# Patient Record
Sex: Female | Born: 1995 | Race: White | Hispanic: No | Marital: Married | Smoking: Never smoker
Health system: Southern US, Community
[De-identification: ages and names within clinical notes are randomized; demographics above are authoritative.]

## PROBLEM LIST (undated history)

## (undated) DIAGNOSIS — J45909 Unspecified asthma, uncomplicated: Secondary | ICD-10-CM

## (undated) HISTORY — DX: Unspecified asthma, uncomplicated: J45.909

---
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
## 2016-01-02 DIAGNOSIS — H5210 Myopia, unspecified eye: Secondary | ICD-10-CM | POA: Diagnosis not present

## 2016-02-21 DIAGNOSIS — K080 Exfoliation of teeth due to systemic causes: Secondary | ICD-10-CM | POA: Diagnosis not present

## 2016-05-06 DIAGNOSIS — J029 Acute pharyngitis, unspecified: Secondary | ICD-10-CM | POA: Diagnosis not present

## 2016-05-06 DIAGNOSIS — E559 Vitamin D deficiency, unspecified: Secondary | ICD-10-CM | POA: Diagnosis not present

## 2016-05-06 DIAGNOSIS — Z0000 Encounter for general adult medical examination without abnormal findings: Secondary | ICD-10-CM | POA: Diagnosis not present

## 2016-05-07 DIAGNOSIS — Z13220 Encounter for screening for lipoid disorders: Secondary | ICD-10-CM | POA: Diagnosis not present

## 2016-05-07 DIAGNOSIS — N390 Urinary tract infection, site not specified: Secondary | ICD-10-CM | POA: Diagnosis not present

## 2016-05-07 DIAGNOSIS — J029 Acute pharyngitis, unspecified: Secondary | ICD-10-CM | POA: Diagnosis not present

## 2016-05-07 DIAGNOSIS — Z0000 Encounter for general adult medical examination without abnormal findings: Secondary | ICD-10-CM | POA: Diagnosis not present

## 2016-08-19 ENCOUNTER — Ambulatory Visit (INDEPENDENT_AMBULATORY_CARE_PROVIDER_SITE_OTHER): Payer: Self-pay | Admitting: Orthopedic Surgery

## 2016-08-21 ENCOUNTER — Ambulatory Visit (INDEPENDENT_AMBULATORY_CARE_PROVIDER_SITE_OTHER): Payer: 59 | Admitting: Orthopedic Surgery

## 2016-08-22 ENCOUNTER — Other Ambulatory Visit (INDEPENDENT_AMBULATORY_CARE_PROVIDER_SITE_OTHER): Payer: Self-pay | Admitting: *Deleted

## 2016-08-22 DIAGNOSIS — M25512 Pain in left shoulder: Secondary | ICD-10-CM

## 2016-09-13 ENCOUNTER — Encounter: Payer: Self-pay | Admitting: Radiology

## 2016-09-13 ENCOUNTER — Ambulatory Visit
Admission: RE | Admit: 2016-09-13 | Discharge: 2016-09-13 | Disposition: A | Discharge status: Home / Self Care | Payer: BLUE CROSS/BLUE SHIELD | Source: Ambulatory Visit | Attending: Orthopedic Surgery | Admitting: Orthopedic Surgery

## 2016-09-13 DIAGNOSIS — M25512 Pain in left shoulder: Secondary | ICD-10-CM

## 2016-09-13 MED ORDER — IOPAMIDOL (ISOVUE-M 200) INJECTION 41%
15.0000 mL | Freq: Once | INTRAMUSCULAR | Status: AC
  Administered 2016-09-13: 15 mL via INTRA_ARTICULAR

## 2016-10-22 DIAGNOSIS — K080 Exfoliation of teeth due to systemic causes: Secondary | ICD-10-CM | POA: Diagnosis not present

## 2016-12-16 DIAGNOSIS — M7542 Impingement syndrome of left shoulder: Secondary | ICD-10-CM | POA: Diagnosis not present

## 2016-12-16 DIAGNOSIS — M9907 Segmental and somatic dysfunction of upper extremity: Secondary | ICD-10-CM | POA: Diagnosis not present

## 2016-12-16 DIAGNOSIS — M9902 Segmental and somatic dysfunction of thoracic region: Secondary | ICD-10-CM | POA: Diagnosis not present

## 2016-12-16 DIAGNOSIS — M9901 Segmental and somatic dysfunction of cervical region: Secondary | ICD-10-CM | POA: Diagnosis not present

## 2016-12-19 DIAGNOSIS — H5213 Myopia, bilateral: Secondary | ICD-10-CM | POA: Diagnosis not present

## 2016-12-19 DIAGNOSIS — H52223 Regular astigmatism, bilateral: Secondary | ICD-10-CM | POA: Diagnosis not present

## 2016-12-20 DIAGNOSIS — M9907 Segmental and somatic dysfunction of upper extremity: Secondary | ICD-10-CM | POA: Diagnosis not present

## 2016-12-20 DIAGNOSIS — M9902 Segmental and somatic dysfunction of thoracic region: Secondary | ICD-10-CM | POA: Diagnosis not present

## 2016-12-20 DIAGNOSIS — M7542 Impingement syndrome of left shoulder: Secondary | ICD-10-CM | POA: Diagnosis not present

## 2016-12-20 DIAGNOSIS — M9901 Segmental and somatic dysfunction of cervical region: Secondary | ICD-10-CM | POA: Diagnosis not present

## 2016-12-23 DIAGNOSIS — H5210 Myopia, unspecified eye: Secondary | ICD-10-CM | POA: Diagnosis not present

## 2016-12-24 DIAGNOSIS — M9902 Segmental and somatic dysfunction of thoracic region: Secondary | ICD-10-CM | POA: Diagnosis not present

## 2016-12-24 DIAGNOSIS — M9907 Segmental and somatic dysfunction of upper extremity: Secondary | ICD-10-CM | POA: Diagnosis not present

## 2016-12-24 DIAGNOSIS — M9901 Segmental and somatic dysfunction of cervical region: Secondary | ICD-10-CM | POA: Diagnosis not present

## 2016-12-24 DIAGNOSIS — M7542 Impingement syndrome of left shoulder: Secondary | ICD-10-CM | POA: Diagnosis not present

## 2016-12-30 DIAGNOSIS — M7542 Impingement syndrome of left shoulder: Secondary | ICD-10-CM | POA: Diagnosis not present

## 2016-12-30 DIAGNOSIS — M9901 Segmental and somatic dysfunction of cervical region: Secondary | ICD-10-CM | POA: Diagnosis not present

## 2016-12-30 DIAGNOSIS — M9907 Segmental and somatic dysfunction of upper extremity: Secondary | ICD-10-CM | POA: Diagnosis not present

## 2016-12-30 DIAGNOSIS — M9902 Segmental and somatic dysfunction of thoracic region: Secondary | ICD-10-CM | POA: Diagnosis not present

## 2017-01-27 DIAGNOSIS — D2239 Melanocytic nevi of other parts of face: Secondary | ICD-10-CM | POA: Diagnosis not present

## 2017-01-27 DIAGNOSIS — L918 Other hypertrophic disorders of the skin: Secondary | ICD-10-CM | POA: Diagnosis not present

## 2017-01-27 DIAGNOSIS — L905 Scar conditions and fibrosis of skin: Secondary | ICD-10-CM | POA: Diagnosis not present

## 2017-01-27 DIAGNOSIS — D2262 Melanocytic nevi of left upper limb, including shoulder: Secondary | ICD-10-CM | POA: Diagnosis not present

## 2017-04-25 DIAGNOSIS — N390 Urinary tract infection, site not specified: Secondary | ICD-10-CM | POA: Diagnosis not present

## 2017-04-30 DIAGNOSIS — K080 Exfoliation of teeth due to systemic causes: Secondary | ICD-10-CM | POA: Diagnosis not present

## 2017-05-06 DIAGNOSIS — E559 Vitamin D deficiency, unspecified: Secondary | ICD-10-CM | POA: Diagnosis not present

## 2017-05-06 DIAGNOSIS — Z0000 Encounter for general adult medical examination without abnormal findings: Secondary | ICD-10-CM | POA: Diagnosis not present

## 2017-06-03 DIAGNOSIS — M7542 Impingement syndrome of left shoulder: Secondary | ICD-10-CM | POA: Diagnosis not present

## 2017-06-03 DIAGNOSIS — M9902 Segmental and somatic dysfunction of thoracic region: Secondary | ICD-10-CM | POA: Diagnosis not present

## 2017-06-03 DIAGNOSIS — M9901 Segmental and somatic dysfunction of cervical region: Secondary | ICD-10-CM | POA: Diagnosis not present

## 2017-06-03 DIAGNOSIS — M9907 Segmental and somatic dysfunction of upper extremity: Secondary | ICD-10-CM | POA: Diagnosis not present

## 2017-06-13 DIAGNOSIS — Z6820 Body mass index (BMI) 20.0-20.9, adult: Secondary | ICD-10-CM | POA: Diagnosis not present

## 2017-06-13 DIAGNOSIS — Z113 Encounter for screening for infections with a predominantly sexual mode of transmission: Secondary | ICD-10-CM | POA: Diagnosis not present

## 2017-06-13 DIAGNOSIS — Z01419 Encounter for gynecological examination (general) (routine) without abnormal findings: Secondary | ICD-10-CM | POA: Diagnosis not present

## 2017-09-09 DIAGNOSIS — Z30430 Encounter for insertion of intrauterine contraceptive device: Secondary | ICD-10-CM | POA: Diagnosis not present

## 2017-10-07 DIAGNOSIS — L659 Nonscarring hair loss, unspecified: Secondary | ICD-10-CM | POA: Diagnosis not present

## 2017-10-07 DIAGNOSIS — Z8349 Family history of other endocrine, nutritional and metabolic diseases: Secondary | ICD-10-CM | POA: Diagnosis not present

## 2017-10-07 DIAGNOSIS — R779 Abnormality of plasma protein, unspecified: Secondary | ICD-10-CM | POA: Diagnosis not present

## 2017-10-15 DIAGNOSIS — R779 Abnormality of plasma protein, unspecified: Secondary | ICD-10-CM | POA: Diagnosis not present

## 2017-10-15 DIAGNOSIS — L659 Nonscarring hair loss, unspecified: Secondary | ICD-10-CM | POA: Diagnosis not present

## 2017-10-15 DIAGNOSIS — Z8349 Family history of other endocrine, nutritional and metabolic diseases: Secondary | ICD-10-CM | POA: Diagnosis not present

## 2017-10-22 DIAGNOSIS — Z30431 Encounter for routine checking of intrauterine contraceptive device: Secondary | ICD-10-CM | POA: Diagnosis not present

## 2018-01-11 IMAGING — XA DG FLUORO GUIDE NDL PLC/BX
2 series · 2 of 2 positions shown · non-contrast
Comparison: none

CLINICAL DATA: Collegiate level golfer, with LEFT shoulder
subluxation/dislocation. Pain. Evaluate for labral pathology.

[Series 1: ortho standard · 1 of 1 slices shown (1 of 2)]
[im 1/1]
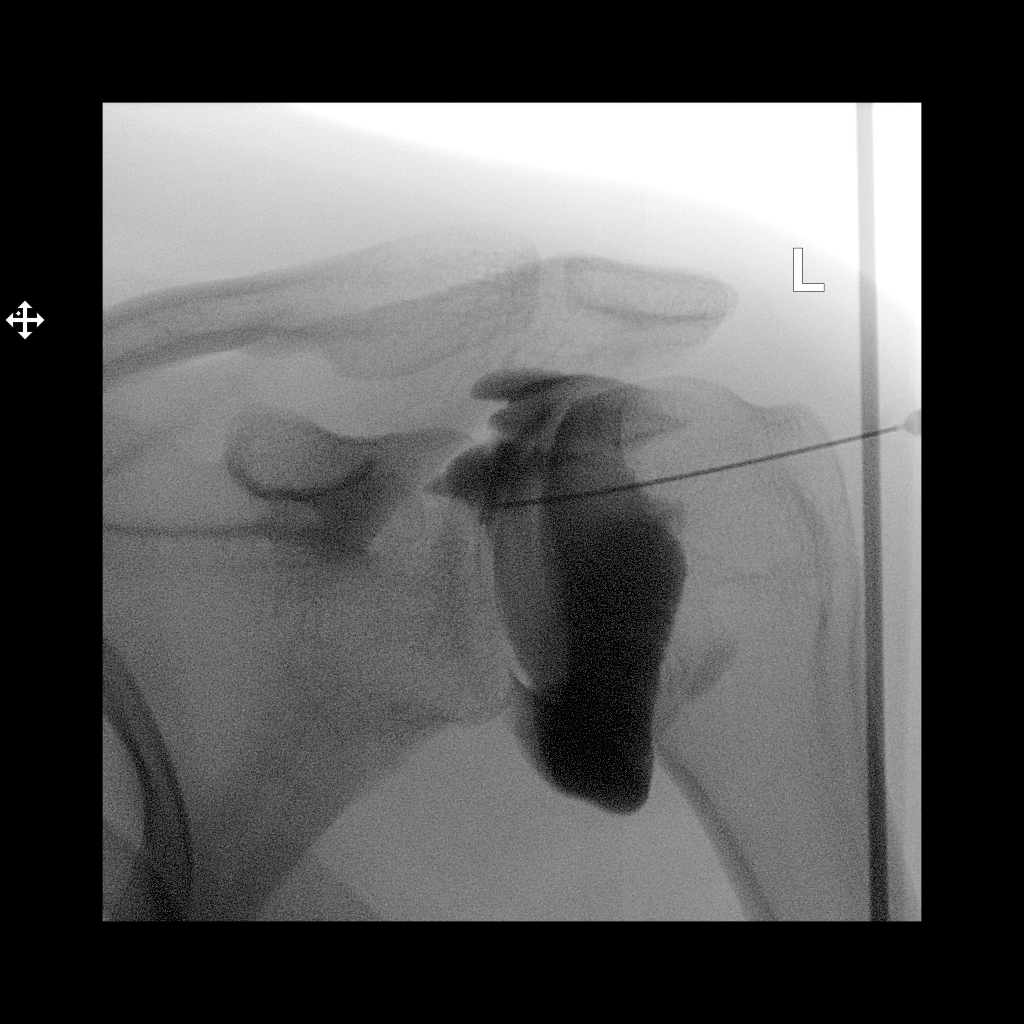

[Series 2: ortho standard · 1 of 1 slices shown (2 of 2)]
[im 1/1]
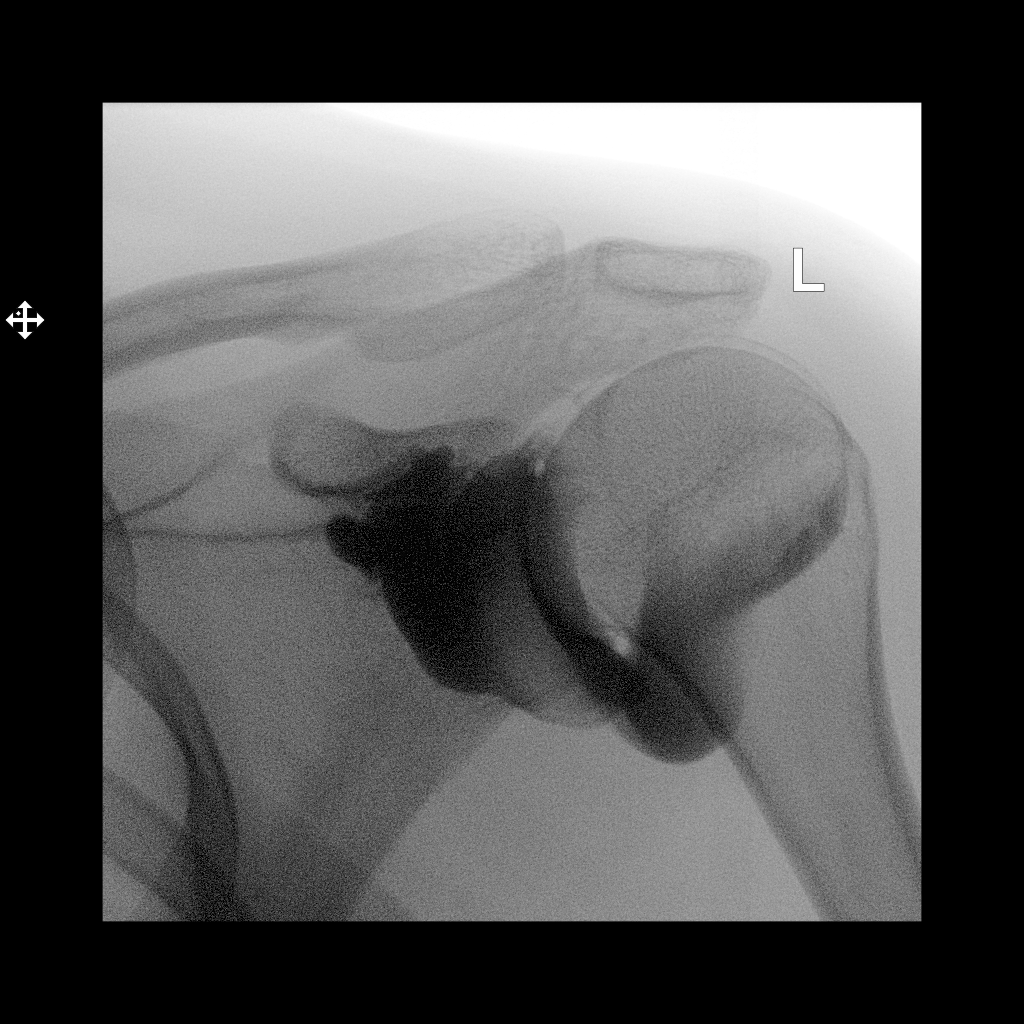

[2 of 2 positions shown; findings below may reference images not displayed]

FLUOROSCOPY TIME:  21 seconds corresponding to a Dose Area Product
of 6.23 ?Gy*m2

PROCEDURE:
LEFT SHOULDER INJECTION UNDER FLUOROSCOPY

Informed written consent was obtained.  Time-out was performed.

An appropriate skin entrance site was determined. The site was
marked, prepped with Betadine, draped in the usual sterile fashion,
and infiltrated locally with 1% Lidocaine. 22 gauge spinal needle
was advanced to the superomedial margin of the humeral head under
intermittent fluoroscopy. 1 ml of Lidocaine injected easily. A
mixture of 0.1 ml Multihance and 20 ml of dilute Omnipaque 180 was
then used to opacify the LEFT shoulder capsule. No immediate
complication.
IMPRESSION: Technically successful LEFT shoulder injection for MRI.

## 2018-01-29 DIAGNOSIS — N920 Excessive and frequent menstruation with regular cycle: Secondary | ICD-10-CM | POA: Diagnosis not present

## 2018-01-29 DIAGNOSIS — R102 Pelvic and perineal pain: Secondary | ICD-10-CM | POA: Diagnosis not present

## 2018-01-29 DIAGNOSIS — Z30432 Encounter for removal of intrauterine contraceptive device: Secondary | ICD-10-CM | POA: Diagnosis not present

## 2018-03-13 DIAGNOSIS — Z111 Encounter for screening for respiratory tuberculosis: Secondary | ICD-10-CM | POA: Diagnosis not present

## 2018-05-28 DIAGNOSIS — M9907 Segmental and somatic dysfunction of upper extremity: Secondary | ICD-10-CM | POA: Diagnosis not present

## 2018-05-28 DIAGNOSIS — M9901 Segmental and somatic dysfunction of cervical region: Secondary | ICD-10-CM | POA: Diagnosis not present

## 2018-05-28 DIAGNOSIS — M7542 Impingement syndrome of left shoulder: Secondary | ICD-10-CM | POA: Diagnosis not present

## 2018-05-28 DIAGNOSIS — M9902 Segmental and somatic dysfunction of thoracic region: Secondary | ICD-10-CM | POA: Diagnosis not present

## 2018-08-03 DIAGNOSIS — E559 Vitamin D deficiency, unspecified: Secondary | ICD-10-CM | POA: Diagnosis not present

## 2018-08-03 DIAGNOSIS — J45909 Unspecified asthma, uncomplicated: Secondary | ICD-10-CM | POA: Diagnosis not present

## 2018-08-03 DIAGNOSIS — J309 Allergic rhinitis, unspecified: Secondary | ICD-10-CM | POA: Diagnosis not present

## 2018-08-03 DIAGNOSIS — Z8639 Personal history of other endocrine, nutritional and metabolic disease: Secondary | ICD-10-CM | POA: Diagnosis not present

## 2020-01-31 DIAGNOSIS — Z01419 Encounter for gynecological examination (general) (routine) without abnormal findings: Secondary | ICD-10-CM | POA: Diagnosis not present

## 2020-01-31 DIAGNOSIS — Z6821 Body mass index (BMI) 21.0-21.9, adult: Secondary | ICD-10-CM | POA: Diagnosis not present

## 2021-01-21 DIAGNOSIS — R059 Cough, unspecified: Secondary | ICD-10-CM | POA: Diagnosis not present

## 2021-01-21 DIAGNOSIS — J069 Acute upper respiratory infection, unspecified: Secondary | ICD-10-CM | POA: Diagnosis not present

## 2021-01-21 DIAGNOSIS — Z03818 Encounter for observation for suspected exposure to other biological agents ruled out: Secondary | ICD-10-CM | POA: Diagnosis not present

## 2021-01-21 DIAGNOSIS — J029 Acute pharyngitis, unspecified: Secondary | ICD-10-CM | POA: Diagnosis not present

## 2021-01-21 DIAGNOSIS — R519 Headache, unspecified: Secondary | ICD-10-CM | POA: Diagnosis not present

## 2021-01-23 DIAGNOSIS — R0989 Other specified symptoms and signs involving the circulatory and respiratory systems: Secondary | ICD-10-CM | POA: Diagnosis not present

## 2021-01-23 DIAGNOSIS — R051 Acute cough: Secondary | ICD-10-CM | POA: Diagnosis not present

## 2021-03-15 DIAGNOSIS — Z30431 Encounter for routine checking of intrauterine contraceptive device: Secondary | ICD-10-CM | POA: Diagnosis not present

## 2021-03-15 DIAGNOSIS — Z30432 Encounter for removal of intrauterine contraceptive device: Secondary | ICD-10-CM | POA: Diagnosis not present

## 2021-04-10 DIAGNOSIS — J45909 Unspecified asthma, uncomplicated: Secondary | ICD-10-CM | POA: Diagnosis not present

## 2021-04-10 DIAGNOSIS — E559 Vitamin D deficiency, unspecified: Secondary | ICD-10-CM | POA: Diagnosis not present

## 2021-04-10 DIAGNOSIS — Z8639 Personal history of other endocrine, nutritional and metabolic disease: Secondary | ICD-10-CM | POA: Diagnosis not present

## 2021-04-10 DIAGNOSIS — R1013 Epigastric pain: Secondary | ICD-10-CM | POA: Diagnosis not present

## 2021-05-08 DIAGNOSIS — Z8639 Personal history of other endocrine, nutritional and metabolic disease: Secondary | ICD-10-CM | POA: Diagnosis not present

## 2021-05-08 DIAGNOSIS — J45909 Unspecified asthma, uncomplicated: Secondary | ICD-10-CM | POA: Diagnosis not present

## 2021-05-08 DIAGNOSIS — E559 Vitamin D deficiency, unspecified: Secondary | ICD-10-CM | POA: Diagnosis not present

## 2021-05-08 DIAGNOSIS — R1013 Epigastric pain: Secondary | ICD-10-CM | POA: Diagnosis not present

## 2021-05-08 DIAGNOSIS — Z13220 Encounter for screening for lipoid disorders: Secondary | ICD-10-CM | POA: Diagnosis not present

## 2021-05-15 DIAGNOSIS — Z8639 Personal history of other endocrine, nutritional and metabolic disease: Secondary | ICD-10-CM | POA: Diagnosis not present

## 2021-05-15 DIAGNOSIS — H6123 Impacted cerumen, bilateral: Secondary | ICD-10-CM | POA: Diagnosis not present

## 2021-05-15 DIAGNOSIS — J45909 Unspecified asthma, uncomplicated: Secondary | ICD-10-CM | POA: Diagnosis not present

## 2021-05-15 DIAGNOSIS — Z0000 Encounter for general adult medical examination without abnormal findings: Secondary | ICD-10-CM | POA: Diagnosis not present

## 2021-05-15 DIAGNOSIS — J309 Allergic rhinitis, unspecified: Secondary | ICD-10-CM | POA: Diagnosis not present

## 2023-08-29 ENCOUNTER — Other Ambulatory Visit: Payer: Self-pay | Admitting: Obstetrics and Gynecology

## 2023-08-29 ENCOUNTER — Ambulatory Visit

## 2023-08-29 DIAGNOSIS — O283 Abnormal ultrasonic finding on antenatal screening of mother: Secondary | ICD-10-CM

## 2023-09-01 ENCOUNTER — Other Ambulatory Visit: Payer: Self-pay

## 2023-09-01 DIAGNOSIS — O283 Abnormal ultrasonic finding on antenatal screening of mother: Secondary | ICD-10-CM | POA: Insufficient documentation

## 2023-09-02 ENCOUNTER — Ambulatory Visit: Attending: Obstetrics and Gynecology

## 2023-09-02 ENCOUNTER — Other Ambulatory Visit: Payer: Self-pay | Admitting: *Deleted

## 2023-09-02 ENCOUNTER — Ambulatory Visit: Admitting: Obstetrics and Gynecology

## 2023-09-02 VITALS — BP 102/67

## 2023-09-02 DIAGNOSIS — O359XX0 Maternal care for (suspected) fetal abnormality and damage, unspecified, not applicable or unspecified: Secondary | ICD-10-CM | POA: Insufficient documentation

## 2023-09-02 DIAGNOSIS — O283 Abnormal ultrasonic finding on antenatal screening of mother: Secondary | ICD-10-CM | POA: Diagnosis present

## 2023-09-02 DIAGNOSIS — Z3A20 20 weeks gestation of pregnancy: Secondary | ICD-10-CM | POA: Diagnosis present

## 2023-09-02 DIAGNOSIS — O3501X0 Maternal care for (suspected) central nervous system malformation or damage in fetus, agenesis of the corpus callosum, not applicable or unspecified: Secondary | ICD-10-CM

## 2023-09-02 NOTE — Progress Notes (Unsigned)
  Maternal-Fetal Medicine Consultation Name: Jeanette Cole MRN: [id number]  G1 P0 at 20w 2d gestation.  Patient is here for fetal anatomy scan.  At your office ultrasound, cavum septum pellucidum (CSP) was not seen. On cell-free fetal DNA screening, the risks of fetal aneuploidies are not increased.  Patient reports no chronic medical conditions.  She does not take any medications other than prenatal vitamins.  Ultrasound We performed a fetal anatomical survey.  Amniotic fluid is normal and good fetal activity seen.  Fetal biometry is consistent with the previously established dates.  The following findings are seen: - The CSP is absent. - No evidence of ventriculomegaly.  No evidence of anterior horn dilations or confluence of anterior horns. - On coronal and sagittal views, I could not identify corpus callosum.  Pericallosal artery is not seen on color Doppler flow. - Posterior fossa and midline appears normal. Cardiac anatomy appears normal.  No other obvious fetal structural defects are seen.  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  Absent CSP is one of the signs of partial or complete agenesis of corpus callosum.  It is difficult to diagnose partial agenesis in many cases. She needs follow-up ultrasound to confirm our findings. In isolated absent CSP, the prognosis is good in more than 80% of the cases.  In about 20% of cases, it can be associated with septo-optic dysplasia and prenatal diagnosis is challenging both by ultrasound and MRI.  I counseled the couple on agenesis of corpus callosum (ACC). ACC associated with an increased risk of fetal chromosomal anomalies (up to 20%) and some genetic syndromes. About 80% will have additional brain anomalies (not seen on today's ultrasound) and many with extracranial anomalies (fetal anatomy appears normal). Isolated ACC have good neurodevelopmental  outcomes (70% of cases).   I recommended amniocentesis for fetal karyotype and microarray.  Whole genome sequencing can be performed to identify genetic conditions.  I explained amniocentesis procedure and possible complication of miscarriage (1 and 500 procedures).  MRI is likely to give additional information on structural brain anomalies.  It is best deferred to 26 to [redacted] weeks gestation.  If additional information will help the couple and decide on continuation of pregnancy, we can order MRI now. Patient clearly stated that termination of pregnancy is not an option, and she would like to wait till later gestational age for MRI. She is undecided about amniocentesis now. Recommendations - An appointment was made for her to return in 3 weeks for fetal growth assessment and evaluation of intracranial structures. - If ACC is confirmed or diagnosis is unclear, MRI to be performed at 26 to [redacted] weeks gestation. - Fetal echocardiography appointment at her next visit.   Consultation including face-to-face (more than 50%) counseling 45 minutes.

## 2023-09-03 NOTE — Progress Notes (Signed)
  Maternal-Fetal Medicine Consultation Name: Jeanette Cole MRN: [id number]  G1 P0 at 20w 2d gestation.  Patient is here for fetal anatomy scan.  At your office ultrasound, cavum septum pellucidum (CSP) was not seen. On cell-free fetal DNA screening, the risks of fetal aneuploidies are not increased.  Patient reports no chronic medical conditions.  She does not take any medications other than prenatal vitamins.  Ultrasound We performed a fetal anatomical survey.  Amniotic fluid is normal and good fetal activity seen.  Fetal biometry is consistent with the previously established dates.  The following findings are seen: - The CSP is absent. - No evidence of ventriculomegaly.  No evidence of anterior horn dilations or confluence of anterior horns. - On coronal and sagittal views, I could not identify corpus callosum.  Pericallosal artery is not seen on color Doppler flow. - Posterior fossa and midline appears normal. Cardiac anatomy appears normal.  No other obvious fetal structural defects are seen.  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  Absent CSP is one of the signs of partial or complete agenesis of corpus callosum.  It is difficult to diagnose partial agenesis in many cases. She needs follow-up ultrasound to confirm our findings. In isolated absent CSP, the prognosis is good in more than 80% of the cases.  In about 20% of cases, it can be associated with septo-optic dysplasia and prenatal diagnosis is challenging both by ultrasound and MRI.  I counseled the couple on agenesis of corpus callosum (ACC). ACC associated with an increased risk of fetal chromosomal anomalies (up to 20%) and some genetic syndromes. About 80% will have additional brain anomalies (not seen on today's ultrasound) and many with extracranial anomalies (fetal anatomy appears normal). Isolated ACC have good neurodevelopmental  outcomes (70% of cases).   I recommended amniocentesis for fetal karyotype and microarray.  Whole genome sequencing can be performed to identify genetic conditions.  I explained amniocentesis procedure and possible complication of miscarriage (1 and 500 procedures).  MRI is likely to give additional information on structural brain anomalies.  It is best deferred to 26 to [redacted] weeks gestation.  If additional information will help the couple and decide on continuation of pregnancy, we can order MRI now. Patient clearly stated that termination of pregnancy is not an option, and she would like to wait till later gestational age for MRI. She is undecided about amniocentesis now. Recommendations - An appointment was made for her to return in 3 weeks for fetal growth assessment and evaluation of intracranial structures. - If ACC is confirmed or diagnosis is unclear, MRI to be performed at 26 to [redacted] weeks gestation. - Fetal echocardiography appointment at her next visit.   Consultation including face-to-face (more than 50%) counseling 45 minutes.

## 2023-09-24 ENCOUNTER — Ambulatory Visit
